# Patient Record
Sex: Female | Born: 1960 | Hispanic: No | Marital: Married | Smoking: Never smoker
Health system: Southern US, Community
[De-identification: ages and names within clinical notes are randomized; demographics above are authoritative.]

## PROBLEM LIST (undated history)

## (undated) DIAGNOSIS — I10 Essential (primary) hypertension: Secondary | ICD-10-CM

## (undated) DIAGNOSIS — D219 Benign neoplasm of connective and other soft tissue, unspecified: Secondary | ICD-10-CM

## (undated) DIAGNOSIS — R011 Cardiac murmur, unspecified: Secondary | ICD-10-CM

## (undated) DIAGNOSIS — E7800 Pure hypercholesterolemia, unspecified: Secondary | ICD-10-CM

## (undated) HISTORY — DX: Cardiac murmur, unspecified: R01.1

## (undated) HISTORY — DX: Essential (primary) hypertension: I10

## (undated) HISTORY — DX: Benign neoplasm of connective and other soft tissue, unspecified: D21.9

## (undated) HISTORY — PX: HEMORROIDECTOMY: SUR656

## (undated) HISTORY — DX: Pure hypercholesterolemia, unspecified: E78.00

---

## 1998-12-18 ENCOUNTER — Other Ambulatory Visit: Admission: RE | Admit: 1998-12-18 | Discharge: 1998-12-18 | Payer: Self-pay | Admitting: Gynecology

## 1998-12-18 ENCOUNTER — Encounter (INDEPENDENT_AMBULATORY_CARE_PROVIDER_SITE_OTHER): Payer: Self-pay

## 2000-08-17 ENCOUNTER — Other Ambulatory Visit: Admission: RE | Admit: 2000-08-17 | Discharge: 2000-08-17 | Payer: Self-pay | Admitting: *Deleted

## 2001-12-02 ENCOUNTER — Other Ambulatory Visit: Admission: RE | Admit: 2001-12-02 | Discharge: 2001-12-02 | Payer: Self-pay | Admitting: *Deleted

## 2002-12-15 ENCOUNTER — Other Ambulatory Visit: Admission: RE | Admit: 2002-12-15 | Discharge: 2002-12-15 | Payer: Self-pay | Admitting: Obstetrics and Gynecology

## 2004-01-30 ENCOUNTER — Other Ambulatory Visit: Admission: RE | Admit: 2004-01-30 | Discharge: 2004-01-30 | Payer: Self-pay | Admitting: Obstetrics and Gynecology

## 2005-01-30 ENCOUNTER — Other Ambulatory Visit: Admission: RE | Admit: 2005-01-30 | Discharge: 2005-01-30 | Payer: Self-pay | Admitting: Obstetrics and Gynecology

## 2006-01-08 ENCOUNTER — Other Ambulatory Visit: Admission: RE | Admit: 2006-01-08 | Discharge: 2006-01-08 | Payer: Self-pay | Admitting: Obstetrics and Gynecology

## 2007-01-17 ENCOUNTER — Other Ambulatory Visit: Admission: RE | Admit: 2007-01-17 | Discharge: 2007-01-17 | Payer: Self-pay | Admitting: Obstetrics and Gynecology

## 2008-01-04 ENCOUNTER — Encounter: Payer: Self-pay | Admitting: Obstetrics and Gynecology

## 2008-01-04 ENCOUNTER — Ambulatory Visit: Payer: Self-pay | Admitting: Obstetrics and Gynecology

## 2008-01-04 ENCOUNTER — Other Ambulatory Visit: Admission: RE | Admit: 2008-01-04 | Discharge: 2008-01-04 | Payer: Self-pay | Admitting: Obstetrics and Gynecology

## 2009-01-04 ENCOUNTER — Other Ambulatory Visit: Admission: RE | Admit: 2009-01-04 | Discharge: 2009-01-04 | Payer: Self-pay | Admitting: Obstetrics and Gynecology

## 2009-01-04 ENCOUNTER — Ambulatory Visit: Payer: Self-pay | Admitting: Obstetrics and Gynecology

## 2009-03-28 ENCOUNTER — Ambulatory Visit (HOSPITAL_BASED_OUTPATIENT_CLINIC_OR_DEPARTMENT_OTHER): Admission: RE | Admit: 2009-03-28 | Discharge: 2009-03-28 | Payer: Self-pay | Admitting: General Surgery

## 2010-01-08 ENCOUNTER — Ambulatory Visit: Payer: Self-pay | Admitting: Obstetrics and Gynecology

## 2010-01-08 ENCOUNTER — Other Ambulatory Visit
Admission: RE | Admit: 2010-01-08 | Discharge: 2010-01-08 | Payer: Self-pay | Source: Home / Self Care | Admitting: Obstetrics and Gynecology

## 2011-01-21 DIAGNOSIS — R011 Cardiac murmur, unspecified: Secondary | ICD-10-CM | POA: Insufficient documentation

## 2011-01-21 DIAGNOSIS — E7800 Pure hypercholesterolemia, unspecified: Secondary | ICD-10-CM | POA: Insufficient documentation

## 2011-01-30 ENCOUNTER — Ambulatory Visit: Payer: PRIVATE HEALTH INSURANCE | Admitting: Obstetrics and Gynecology

## 2011-01-30 ENCOUNTER — Ambulatory Visit (INDEPENDENT_AMBULATORY_CARE_PROVIDER_SITE_OTHER): Payer: PRIVATE HEALTH INSURANCE

## 2011-01-30 ENCOUNTER — Ambulatory Visit (INDEPENDENT_AMBULATORY_CARE_PROVIDER_SITE_OTHER): Payer: PRIVATE HEALTH INSURANCE | Admitting: Obstetrics and Gynecology

## 2011-01-30 ENCOUNTER — Other Ambulatory Visit (HOSPITAL_COMMUNITY)
Admission: RE | Admit: 2011-01-30 | Discharge: 2011-01-30 | Disposition: A | Discharge status: Home / Self Care | Payer: PRIVATE HEALTH INSURANCE | Source: Ambulatory Visit | Attending: Obstetrics and Gynecology | Admitting: Obstetrics and Gynecology

## 2011-01-30 ENCOUNTER — Ambulatory Visit: Payer: Self-pay

## 2011-01-30 ENCOUNTER — Encounter: Payer: Self-pay | Admitting: Obstetrics and Gynecology

## 2011-01-30 ENCOUNTER — Other Ambulatory Visit: Payer: Self-pay

## 2011-01-30 ENCOUNTER — Other Ambulatory Visit: Payer: Self-pay | Admitting: Obstetrics and Gynecology

## 2011-01-30 ENCOUNTER — Ambulatory Visit: Payer: Self-pay | Admitting: Obstetrics and Gynecology

## 2011-01-30 VITALS — BP 130/76 | Ht 62.25 in | Wt 146.0 lb

## 2011-01-30 DIAGNOSIS — N852 Hypertrophy of uterus: Secondary | ICD-10-CM

## 2011-01-30 DIAGNOSIS — Z01419 Encounter for gynecological examination (general) (routine) without abnormal findings: Secondary | ICD-10-CM | POA: Insufficient documentation

## 2011-01-30 DIAGNOSIS — R102 Pelvic and perineal pain: Secondary | ICD-10-CM

## 2011-01-30 DIAGNOSIS — D649 Anemia, unspecified: Secondary | ICD-10-CM

## 2011-01-30 DIAGNOSIS — D219 Benign neoplasm of connective and other soft tissue, unspecified: Secondary | ICD-10-CM

## 2011-01-30 DIAGNOSIS — D259 Leiomyoma of uterus, unspecified: Secondary | ICD-10-CM

## 2011-01-30 NOTE — Progress Notes (Signed)
Patient came to see me today for her annual GYN exam. She continues to have very large fibroids. She is really asymptomatic. She says her periods are fine. She does have iron deficiency anemia. For a while she took iron but stopped. She brought her lab work today and she does need to be back on iron. She is up-to-date on mammograms. She has no abnormal bleeding. She is having no pelvic pain. She contraceptives by withdrawal.  Physical examination: Ashlee Harmon present HEENT within normal limits. Neck: Thyroid not large. No masses. Supraclavicular nodes: not enlarged. Breasts: Examined in both sitting midline position. No skin changes and no masses. Abdomen: Soft no guarding rebound or masses or hernia. Pelvic: External: Within normal limits. BUS: Within normal limits. Vaginal:within normal limits. Good estrogen effect. No evidence of cystocele rectocele or enterocele. Cervix: clean. Uterus: 20 week size fibroids. Adnexa: cannot evaluate due to large size of fibroids. Rectovaginal exam: Confirmatory and negative. Extremities: Within normal limits.  Assessment: Fibroids with anemia  Plan: Due to large size of fibroids we went ahead and ultrasound her to evaluate her ovaries. Her uterus is enlarged by multiple fibroids. The largest is 10 cm. See report for dimensions of other ones. Her endometrial echo is 6.3 mm. She has no ovarian pathology. Her cul-de-sac is free of fluid. She was reassured. She doesn't have indications for surgery. She will reinitiate ferrous sulfate 1 daily. We'll continue yearly mammograms. We'll continue yearly ultrasounds to evaluate her ovaries.

## 2011-02-05 ENCOUNTER — Encounter: Payer: Self-pay | Admitting: Obstetrics and Gynecology

## 2011-02-07 IMAGING — CR DG CHEST 2V
2 series · 2 of 2 positions shown · non-contrast
Comparison: None

CLINICAL DATA: Preop hemorrhoids.  Hypertension.

CHEST - 2 VIEW

[w chest pa]
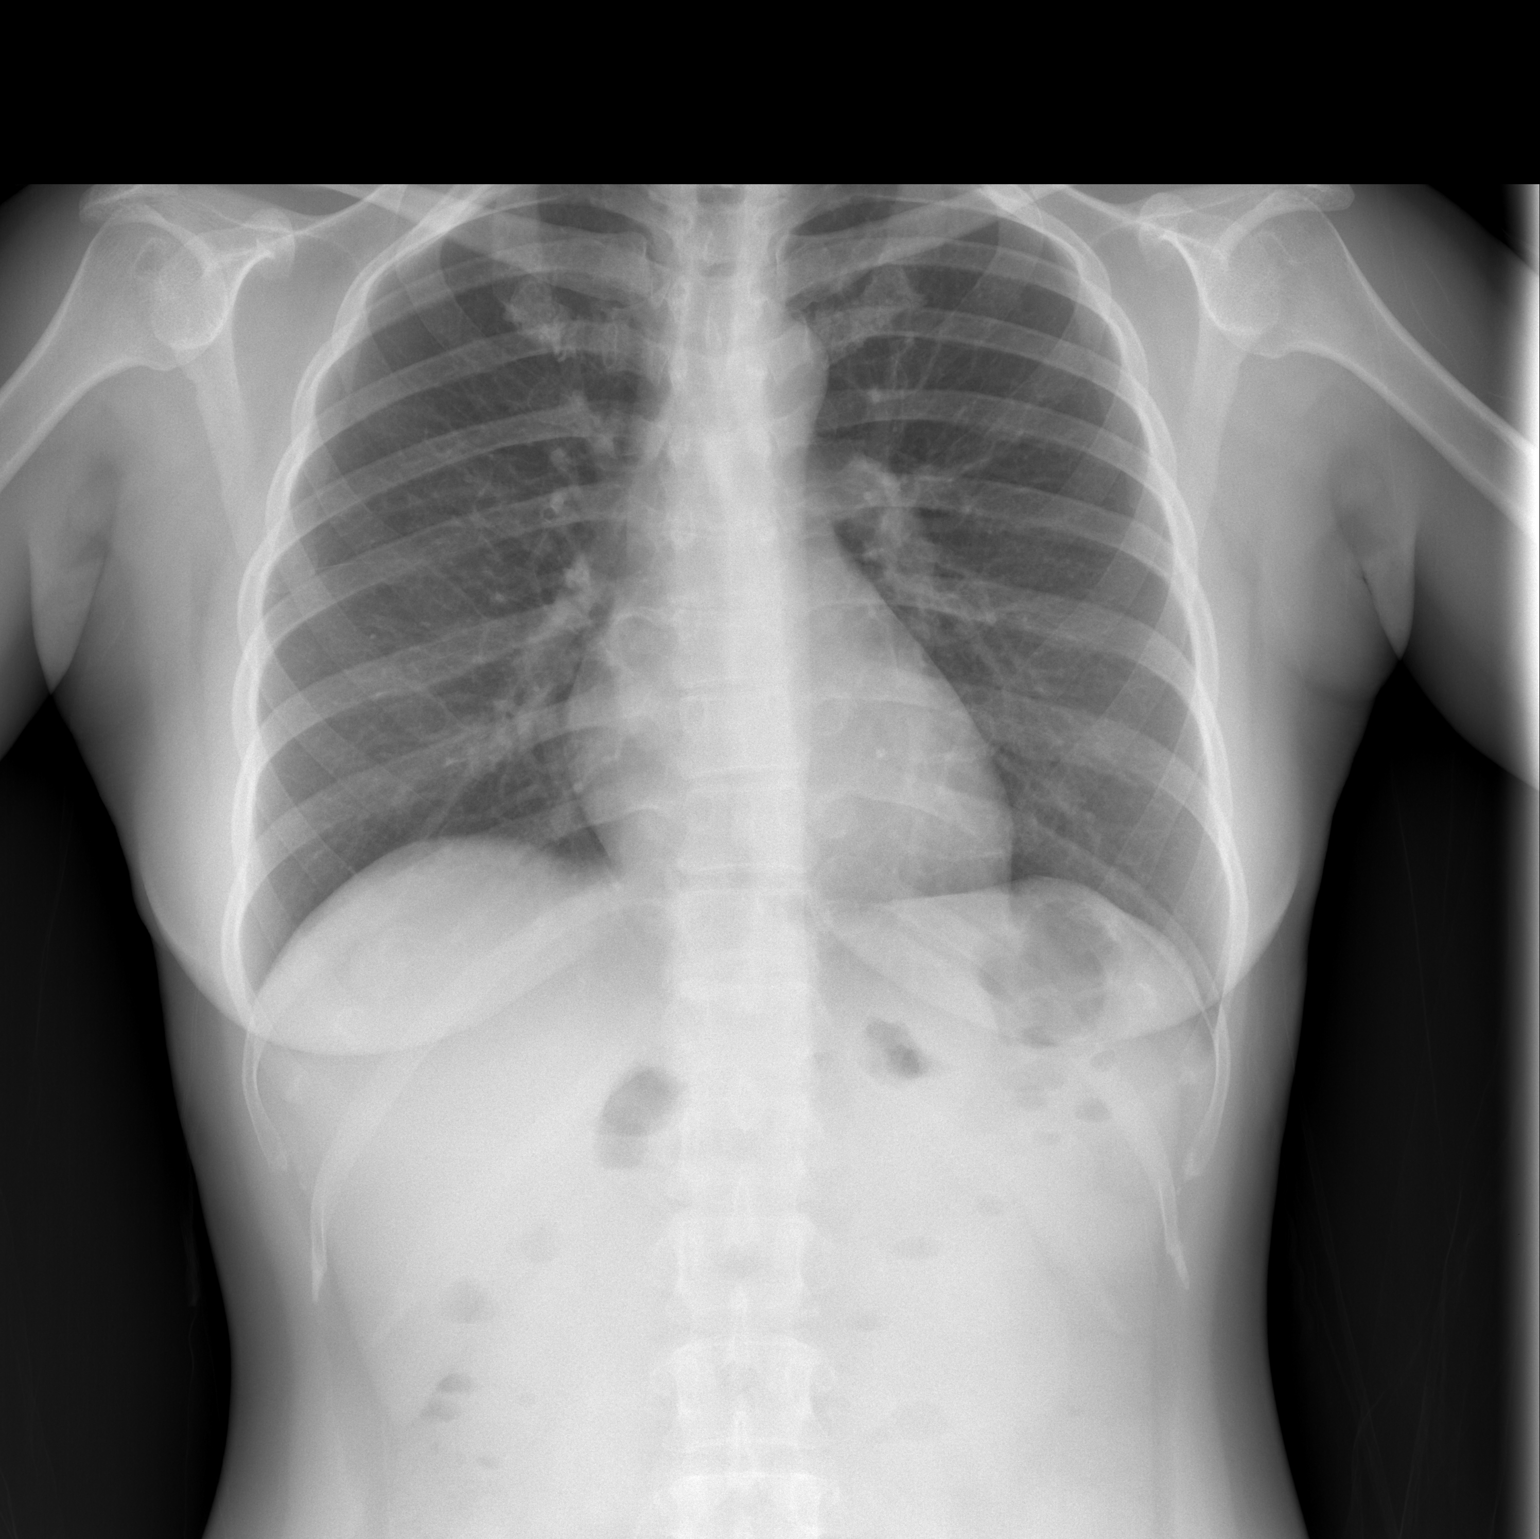

[w chest lat]
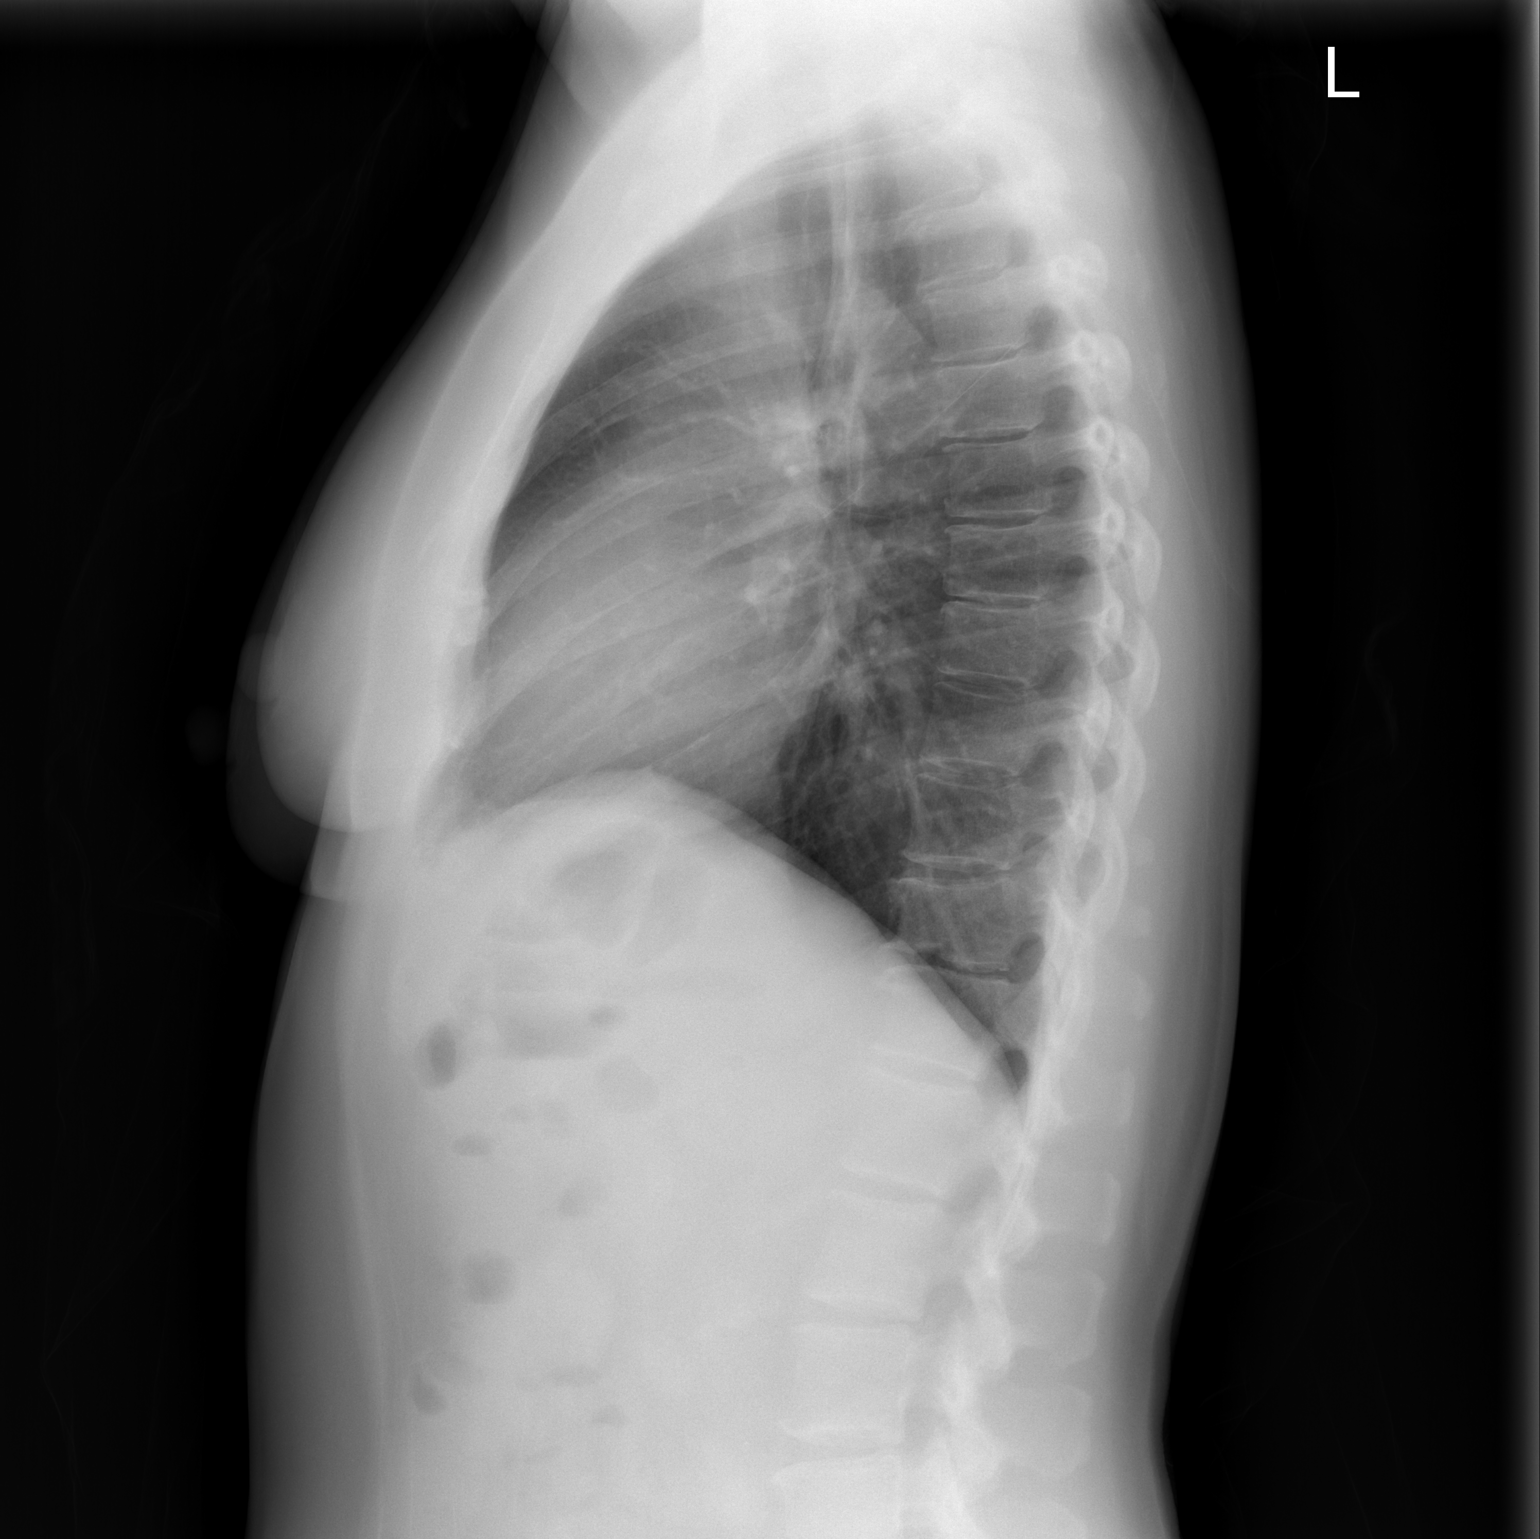

[2 of 2 positions shown; findings below may reference images not displayed]

FINDINGS: Heart and mediastinal contours are within normal limits.
No focal opacities or effusions.  No acute bony abnormality.
IMPRESSION: No active disease.

## 2011-07-03 ENCOUNTER — Encounter: Payer: Self-pay | Admitting: Obstetrics and Gynecology

## 2011-12-17 ENCOUNTER — Telehealth: Payer: Self-pay | Admitting: *Deleted

## 2011-12-17 DIAGNOSIS — D219 Benign neoplasm of connective and other soft tissue, unspecified: Secondary | ICD-10-CM

## 2011-12-17 NOTE — Telephone Encounter (Signed)
Pt called to schedule annual for next month and asked if should could have ultrasound same day.Due to fibroid. Okay to do this per office note. Order placed.

## 2012-02-01 ENCOUNTER — Ambulatory Visit (INDEPENDENT_AMBULATORY_CARE_PROVIDER_SITE_OTHER): Payer: PRIVATE HEALTH INSURANCE | Admitting: Obstetrics and Gynecology

## 2012-02-01 ENCOUNTER — Other Ambulatory Visit: Payer: Self-pay | Admitting: Obstetrics and Gynecology

## 2012-02-01 ENCOUNTER — Ambulatory Visit (INDEPENDENT_AMBULATORY_CARE_PROVIDER_SITE_OTHER): Payer: PRIVATE HEALTH INSURANCE

## 2012-02-01 ENCOUNTER — Encounter: Payer: Self-pay | Admitting: Obstetrics and Gynecology

## 2012-02-01 VITALS — BP 140/84 | Ht 60.5 in | Wt 128.0 lb

## 2012-02-01 DIAGNOSIS — N859 Noninflammatory disorder of uterus, unspecified: Secondary | ICD-10-CM

## 2012-02-01 DIAGNOSIS — N83339 Acquired atrophy of ovary and fallopian tube, unspecified side: Secondary | ICD-10-CM

## 2012-02-01 DIAGNOSIS — D251 Intramural leiomyoma of uterus: Secondary | ICD-10-CM

## 2012-02-01 DIAGNOSIS — D259 Leiomyoma of uterus, unspecified: Secondary | ICD-10-CM

## 2012-02-01 DIAGNOSIS — D219 Benign neoplasm of connective and other soft tissue, unspecified: Secondary | ICD-10-CM

## 2012-02-01 DIAGNOSIS — N858 Other specified noninflammatory disorders of uterus: Secondary | ICD-10-CM

## 2012-02-01 DIAGNOSIS — N852 Hypertrophy of uterus: Secondary | ICD-10-CM

## 2012-02-01 DIAGNOSIS — Z01419 Encounter for gynecological examination (general) (routine) without abnormal findings: Secondary | ICD-10-CM

## 2012-02-01 DIAGNOSIS — D252 Subserosal leiomyoma of uterus: Secondary | ICD-10-CM

## 2012-02-01 NOTE — Progress Notes (Signed)
Patient came to see me today for her annual GYN exam. We have been watching her with large fibroids for a considerable number of years. She is is completely asymptomatic. She does have slightly heavy periods but she always felt but they are tolerable. Her hemoglobin is 13.8. We have discussed hysterectomy for many years but she's always been reluctant to do so. Last year her uterus was 20 weeks size but  her ovaries could be imaged on ultrasound. On ultrasound today she continues to have many large fibroids and they have increased in size from last year. Please see dimensions on ultrasound report. Her endometrial echo is 5.7 mm. Her fibroids are subserosal and intramural. There is fluid in the endometrial cavity. This year her ovaries can not be imaged as in the past. They use withdrawal for birth control. She is aware that it is not a particularly effective method in terms of pregnancy rate but that is what she wants to do. Certainly the  large fibroids make pregnancy unlikely. She is having no pelvic pain or pressure symptoms. She has always had normal Pap smears. Her last Pap smear was 2012. She had a normal mammogram this year.  Physical examination:Kari wilkinson presentHEENT within normal limits. Neck: Thyroid not large. No masses. Supraclavicular nodes: not enlarged. Breasts: Examined in both sitting and lying  position. No skin changes and no masses. Abdomen: Soft no guarding rebound or masses except fibroids. Pelvic: External: Within normal limits. BUS: Within normal limits. Vaginal:within normal limits. Good estrogen effect. No evidence of cystocele rectocele or enterocele. Cervix: clean. Uterus: 23 week fibroids (2 fingerbreaths above umbilicus). Adnexa: No masses. Rectovaginal exam: Confirmatory and negative. Extremities: Within normal limits.  Assessment: Large fibroids with inability to feel the ovaries or  see them on ultrasound.  Plan: we we discussed hysterectomy today. We once again  discussed the problems with not being able to evaluate her ovaries and the risks that she has ovarian pathology. We discussed the fact that the fibroids are  growing makes sarcoma an unlikely but possible scenario. She knows we would only be able to diagnose that with surgery. We discussed risks of infection. For the moment she still wants to watch them. She will continue yearly exams with ultrasound. She appreciates that there are significant number of pregnancy failures with withdrawal. She will continue yearly mammograms. Pap not done.The new Pap smear guidelines were discussed with the patient.

## 2012-02-01 NOTE — Patient Instructions (Signed)
Continue yearly mammograms. Continue yearly ultrasounds.

## 2012-02-02 LAB — URINALYSIS W MICROSCOPIC + REFLEX CULTURE
Bacteria, UA: NONE SEEN
Bilirubin Urine: NEGATIVE
Glucose, UA: NEGATIVE mg/dL
Ketones, ur: NEGATIVE mg/dL
Nitrite: NEGATIVE
Protein, ur: NEGATIVE mg/dL
Specific Gravity, Urine: 1.015 (ref 1.005–1.030)
pH: 6.5 (ref 5.0–8.0)

## 2012-07-04 ENCOUNTER — Encounter: Payer: Self-pay | Admitting: Gynecology

## 2012-10-05 ENCOUNTER — Other Ambulatory Visit: Payer: Self-pay | Admitting: Gynecology

## 2012-10-05 DIAGNOSIS — D219 Benign neoplasm of connective and other soft tissue, unspecified: Secondary | ICD-10-CM

## 2013-01-18 ENCOUNTER — Ambulatory Visit (INDEPENDENT_AMBULATORY_CARE_PROVIDER_SITE_OTHER): Payer: PRIVATE HEALTH INSURANCE | Admitting: Gynecology

## 2013-01-18 ENCOUNTER — Other Ambulatory Visit: Payer: Self-pay | Admitting: Gynecology

## 2013-01-18 ENCOUNTER — Other Ambulatory Visit: Payer: PRIVATE HEALTH INSURANCE

## 2013-01-18 ENCOUNTER — Encounter: Payer: PRIVATE HEALTH INSURANCE | Admitting: Gynecology

## 2013-01-18 ENCOUNTER — Ambulatory Visit (INDEPENDENT_AMBULATORY_CARE_PROVIDER_SITE_OTHER): Payer: PRIVATE HEALTH INSURANCE

## 2013-01-18 ENCOUNTER — Encounter: Payer: Self-pay | Admitting: Gynecology

## 2013-01-18 VITALS — BP 118/76 | Ht 59.75 in | Wt 125.0 lb

## 2013-01-18 DIAGNOSIS — N858 Other specified noninflammatory disorders of uterus: Secondary | ICD-10-CM

## 2013-01-18 DIAGNOSIS — N951 Menopausal and female climacteric states: Secondary | ICD-10-CM

## 2013-01-18 DIAGNOSIS — R9389 Abnormal findings on diagnostic imaging of other specified body structures: Secondary | ICD-10-CM

## 2013-01-18 DIAGNOSIS — D251 Intramural leiomyoma of uterus: Secondary | ICD-10-CM

## 2013-01-18 DIAGNOSIS — D259 Leiomyoma of uterus, unspecified: Secondary | ICD-10-CM

## 2013-01-18 DIAGNOSIS — Z01419 Encounter for gynecological examination (general) (routine) without abnormal findings: Secondary | ICD-10-CM

## 2013-01-18 DIAGNOSIS — Z8639 Personal history of other endocrine, nutritional and metabolic disease: Secondary | ICD-10-CM

## 2013-01-18 DIAGNOSIS — D252 Subserosal leiomyoma of uterus: Secondary | ICD-10-CM

## 2013-01-18 DIAGNOSIS — D219 Benign neoplasm of connective and other soft tissue, unspecified: Secondary | ICD-10-CM

## 2013-01-18 DIAGNOSIS — Z1159 Encounter for screening for other viral diseases: Secondary | ICD-10-CM

## 2013-01-18 DIAGNOSIS — N859 Noninflammatory disorder of uterus, unspecified: Secondary | ICD-10-CM

## 2013-01-18 DIAGNOSIS — N852 Hypertrophy of uterus: Secondary | ICD-10-CM

## 2013-01-18 LAB — PREGNANCY, URINE: Preg Test, Ur: NEGATIVE

## 2013-01-18 MED ORDER — VITAMIN D (ERGOCALCIFEROL) 1.25 MG (50000 UNIT) PO CAPS: ORAL_CAPSULE | ORAL | Status: DC

## 2013-01-18 NOTE — Addendum Note (Signed)
Addended by: Bertram Savin A on: 01/18/2013 10:33 AM   Modules accepted: Orders

## 2013-01-18 NOTE — Progress Notes (Signed)
Ashlee Harmon [birth year] [id number]   History:    52 y.o.  for annual gyn exam who has had long standing history of fibroid uterus and has been followed with annual ultrasounds. Patient has been asymptomatic and endolaser motor symptoms. She did state that she had a cycle in March and April but did not have one in May through August but did have a cycle September October and none in November. 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 In 2006 and 13 her uterus was 18 week size and normal ovaries today's ultrasound with the following:  Uterus measured 22.2 x 15.1 x 10.6 cm with endometrial stripe of 4 mm. Several subserosal intramural myomas totaling 6 the largest one measuring 54 x 45 mm. Endometrial cavity with with fluid filled area in the fundus. Noted echogenic mass left uterus suggestive within endometrial cavity measured 70 bites 26 x 25 mm. Right and left ovary were otherwise normal.  Patient is using withdrawal for contraception. She is a gravida 3 para 2 AB 1 (one cesarean section and one vaginal delivery in the past). Patient has always had normal Pap smears. Patient's last colonoscopy was in 2011 reports be normal. She has not had a bone density study as of yet.  Past medical history,surgical history, family history and social history were all reviewed and documented in the EPIC chart.  Gynecologic History Patient's last menstrual period was 11/18/2012. Contraception: none Last Pap: 2012. Results were: normal Last mammogram: 2014. Results were: normal  Obstetric History OB History  Gravida Para Term Preterm AB SAB TAB Ectopic Multiple Living  3 2   1     2     # Outcome Date GA Lbr Len/2nd Weight Sex Delivery Anes PTL Lv  3 ABT           2 PAR           1 PAR                ROS: A ROS was performed and pertinent positives  and negatives are included in the history.  GENERAL: No fevers or chills. HEENT: No change in vision, no earache, sore throat or sinus congestion. NECK: No pain or stiffness. CARDIOVASCULAR: No chest pain or pressure. No palpitations. PULMONARY: No shortness of breath, cough or wheeze. GASTROINTESTINAL: No abdominal pain, nausea, vomiting or diarrhea, melena or bright red blood per rectum. GENITOURINARY: No urinary frequency, urgency, hesitancy or dysuria. MUSCULOSKELETAL: No joint or muscle pain, no back pain, no recent trauma. DERMATOLOGIC: No rash, no itching, no lesions. ENDOCRINE: No polyuria, polydipsia, no heat or cold intolerance. No recent change in weight. HEMATOLOGICAL: No anemia or easy bruising or bleeding. NEUROLOGIC: No headache, seizures, numbness, tingling or weakness. PSYCHIATRIC: No depression, no loss of interest in normal activity or change in sleep pattern.     Exam: chaperone present  BP 118/76  Ht 4' 11.75" (1.518 m)  Wt 125 lb (56.7 kg)  BMI 24.61 kg/m2  LMP 11/18/2012  Body mass index is 24.61 kg/(m^2).  General appearance : Well developed well nourished female. No acute distress HEENT: Neck supple, trachea midline, no carotid bruits, no thyroidmegaly Lungs: Clear to auscultation, no rhonchi or wheezes, or rib retractions  Heart: Regular rate and rhythm, no murmurs or gallops Breast:Examined in sitting and supine position were symmetrical  in appearance, no palpable masses or tenderness,  no skin retraction, no nipple inversion, no nipple discharge, no skin discoloration, no axillary or supraclavicular lymphadenopathy Abdomen: no palpable masses or tenderness, no rebound or guarding Extremities: no edema or skin discoloration or tenderness  Pelvic:  Bartholin, Urethra, Skene Glands: Within normal limits             Vagina: No gross lesions or discharge  Cervix: No gross lesions or discharge  Uterus  Fundal height approximately 2 fingerbreadths above the  umbilicus  Adnexa Difficult to assess due to size of uterus  Anus and perineum  normal   Rectovaginal  normal sphincter tone without palpated masses or tenderness             Hemoccult PCP provides     Assessment/Plan:  52 y.o. female for annual exam who is perimenopausal has had several menstrual cycles this year but no visible motor symptoms. We will check an [facility] today. Patient with enlarging fibroid uterus throughout the years. We discussed the concerns about malignant transformation of these fibroids into sarcomas  when they should be decreasing in size . The patient would like to now consider having a hysterectomy sometime in the beginning of next year. We did do an endometrial biopsy after she was counseled and tissues submitted for histological evaluation. Her vitamin D deficiency she'll be treated with vitamin D 50,000 units one tablet by mouth q. Weekly for 12 weeks. She will then return to the office for vitamin D level and then she may resume at 2000 units daily. Meanwhile she should continue on calcium 1200 mg daily along with weightbearing exercises for osteoporosis prevention. Next year we will do a bone density study. Pap smear was not done today in accordance to the new guidelines.  New CDC guidelines is recommending patients be tested once in her lifetime for hepatitis C antibody who were born between [birth year] through 1967. This was discussed with the patient today and has agreed to be tested today.  Literature and information on fibroids and hysterectomy was provided. Urine pregnancy test had been obtained as well.  Note: This dictation was prepared with  Dragon/digital dictation along withSmart phrase technology. Any transcriptional errors that result from this process are unintentional.   Ok Edwards MD, 9:49 AM 01/18/2013

## 2013-01-18 NOTE — Patient Instructions (Addendum)
Hysterectomy Information  A hysterectomy is a procedure where your uterus is surgically removed. It will no longer be possible to have menstrual periods or to become pregnant. The tubes and ovaries can be removed (bilateral salpingo-oopherectomy) during this surgery as well.  REASONS FOR A HYSTERECTOMY  Persistent, abnormal bleeding.  Lasting (chronic) pelvic pain or infection.  The lining of the uterus (endometrium) starts growing outside the uterus (endometriosis).  The endometrium starts growing in the muscle of the uterus (adenomyosis).  The uterus falls down into the vagina (pelvic organ prolapse).  Symptomatic uterine fibroids.  Precancerous cells.  Cervical cancer or uterine cancer. TYPES OF HYSTERECTOMIES  Supracervical hysterectomy. This type removes the top part of the uterus, but not the cervix.  Total hysterectomy. This type removes the uterus and cervix.  Radical hysterectomy. This type removes the uterus, cervix, and the fibrous tissue that holds the uterus in place in the pelvis (parametrium). WAYS A HYSTERECTOMY CAN BE PERFORMED  Abdominal hysterectomy. A large surgical cut (incision) is made in the abdomen. The uterus is removed through this incision.  Vaginal hysterectomy. An incision is made in the vagina. The uterus is removed through this incision. There are no abdominal incisions.  Conventional laparoscopic hysterectomy. A thin, lighted tube with a camera (laparoscope) is inserted into 3 or 4 small incisions in the abdomen. The uterus is cut into small pieces. The small pieces are removed through the incisions, or they are removed through the vagina.  Laparoscopic assisted vaginal hysterectomy (LAVH). Three or four small incisions are made in the abdomen. Part of the surgery is performed laparoscopically and part vaginally. The uterus is removed through the vagina.  Robot-assisted laparoscopic hysterectomy. A laparoscope is inserted into 3 or 4 small  incisions in the abdomen. A computer-controlled device is used to give the surgeon a 3D image. This allows for more precise movements of surgical instruments. The uterus is cut into small pieces and removed through the incisions or removed through the vagina. RISKS OF HYSTERECTOMY   Bleeding and risk of blood transfusion. Tell your caregiver if you do not want to receive any blood products.  Blood clots in the legs or lung.  Infection.  Injury to surrounding organs.  Anesthesia problems or side effects.  Conversion to an abdominal hysterectomy. WHAT TO EXPECT AFTER A HYSTERECTOMY  You will be given pain medicine.  You will need to have someone with you for the first 3 to 5 days after you go home.  You will need to follow up with your surgeon in 2 to 4 weeks after surgery to evaluate your progress.  You may have early menopause symptoms like hot flashes, night sweats, and insomnia.  If you had a hysterectomy for a problem that was not a cancer or a condition that could lead to cancer, then you no longer need Pap tests. However, even if you no longer need a Pap test, a regular exam is a good idea to make sure no other problems are starting. Document Released: 07/15/2000 Document Revised: 04/13/2011 Document Reviewed: 08/30/2010 [facility] Patient Information 2014 [place], [place]. Uterine Fibroid A uterine fibroid is a growth (tumor) that occurs in a woman's uterus. This type of tumor is not cancerous and does not spread out of the uterus. A woman can have one or many fibroids, and the fiboid(s) can become quite large. A fibroid can vary in size, weight, and where it grows in the uterus. Most fibroids do not require medical treatment, but some can cause pain  or heavy bleeding during and between periods. CAUSES  A fibroid is the result of a single uterine cell that keeps growing (unregulated), which is different than most cells in the human body. Most cells have a control mechanism that keeps  them from reproducing without control.  SYMPTOMS   Bleeding.  Pelvic pain and pressure.  Bladder problems due to the size of the fibroid.  Infertility and miscarriages depending on the size and location of the fibroid. DIAGNOSIS  A diagnosis is made by physical exam. Your caregiver may feel the lumpy tumors during a pelvic exam. Important information regarding size, location, and number of tumors can be gained by having an ultrasound. It is rare that other tests, such as a CT scan or MRI, are needed. TREATMENT   Your caregiver may recommend watchful waiting. This involves getting the fibroid checked by your caregiver to see if the fibroids grow or shrink.   Hormonal treatment or an intrauterine device (IUD) may be prescribed.   Surgery may be needed to remove the fibroids (myomectomy) or the uterus (hysterectomy). This depends on your situation. When fibroids interfere with fertility and a woman wants to become pregnant, a caregiver may recommend having the fibroids removed.  HOME CARE INSTRUCTIONS  Home care depends on how you were treated. In general:   Keep all follow-up appointments with your caregiver.   Only take medicine as told by your caregiver. Do not take aspirin. It can cause bleeding.   If you have excessive periods and soak tampons or pads in a half hour or less, contact your caregiver immediately. If your periods are troublesome but not so heavy, lie down with your feet raised slightly above your heart. Place cold packs on your lower abdomen.   If your periods are heavy, write down the number of pads or tampons you use per month. Bring this information to your caregiver.   Talk to your caregiver about taking iron pills.   Include green vegetables in your diet.   If you were prescribed a hormonal treatment, take the hormonal medicines as directed.   If you need surgery, ask your caregiver for information on your specific surgery.  SEEK IMMEDIATE MEDICAL  CARE IF:  You have pelvic pain or cramps not controlled with medicines.   You have a sudden increase in pelvic pain.   You have an increase of bleeding between and during periods.   You feel lightheaded or have fainting episodes.  MAKE SURE YOU:  Understand these instructions.  Will watch your condition.  Will get help right away if you are not doing well or get worse. Document Released: 01/17/2000 Document Revised: 04/13/2011 Document Reviewed: 08/18/2012 [facility] Patient Information 2014 [place], [place]. Shingles Vaccine What You Need to Know WHAT IS SHINGLES?  Shingles is a painful skin rash, often with blisters. It is also called Herpes Zoster or just Zoster.  A shingles rash usually appears on one side of the face or body and lasts from 2 to 4 weeks. Its main symptom is pain, which can be quite severe. Other symptoms of shingles can include fever, headache, chills, and upset stomach. Very rarely, a shingles infection can lead to pneumonia, hearing problems, blindness, brain inflammation (encephalitis), or death.  For about 1 person in 5, severe pain can continue even after the rash clears up. This is called post-herpetic neuralgia.  Shingles is caused by the Varicella Zoster virus. This is the same virus that causes chickenpox. Only someone who has had a case of  chickenpox or rarely, has gotten chickenpox vaccine, can get shingles. The virus stays in your body. It can reappear many years later to cause a case of shingles.  You cannot catch shingles from another person with shingles. However, a person who has never had chickenpox (or chickenpox vaccine) could get chickenpox from someone with shingles. This is not very common.  Shingles is far more common in people 80 and older than in younger people. It is also more common in people whose immune systems are weakened because of a disease such as cancer or drugs such as steroids or chemotherapy.  At least 1 million people get  shingles per year in the [place]. SHINGLES VACCINE  A vaccine for shingles was licensed in 2006. In clinical trials, the vaccine reduced the risk of shingles by 50%. It can also reduce the pain in people who still get shingles after being vaccinated.  A single dose of shingles vaccine is recommended for adults 62 years of age and older. SOME PEOPLE SHOULD NOT GET SHINGLES VACCINE OR SHOULD WAIT A person should not get shingles vaccine if he or she:  Has ever had a life-threatening allergic reaction to gelatin, the antibiotic neomycin, or any other component of shingles vaccine. Tell your caregiver if you have any severe allergies.  Has a weakened immune system because of current:  AIDS or another disease that affects the immune system.  Treatment with drugs that affect the immune system, such as prolonged use of high-dose steroids.  Cancer treatment, such as radiation or chemotherapy.  Cancer affecting the bone marrow or lymphatic system, such as leukemia or lymphoma.  Is pregnant, or might be pregnant. Women should not become pregnant until at least 4 weeks after getting shingles vaccine. Someone with a minor illness, such as a cold, may be vaccinated. Anyone with a moderate or severe acute illness should usually wait until he or she recovers before getting the vaccine. This includes anyone with a temperature of 101.3 F (38 C) or higher. WHAT ARE THE RISKS FROM SHINGLES VACCINE?  A vaccine, like any medicine, could possibly cause serious problems, such as severe allergic reactions. However, the risk of a vaccine causing serious harm, or death, is extremely small.  No serious problems have been identified with shingles vaccine. Mild Problems  Redness, soreness, swelling, or itching at the site of the injection (about 1 person in 3).  Headache (about 1 person in 70). Like all vaccines, shingles vaccine is being closely monitored for unusual or severe problems. WHAT IF THERE  IS A MODERATE OR SEVERE REACTION? What should I look for? Any unusual condition, such as a severe allergic reaction or a high fever. If a severe allergic reaction occurred, it would be within a few minutes to an hour after the shot. Signs of a serious allergic reaction can include difficulty breathing, weakness, hoarseness or wheezing, a fast heartbeat, hives, dizziness, paleness, or swelling of the throat. What should I do?  Call your caregiver, or get the person to a caregiver right away.  Tell the caregiver what happened, the date and time it happened, and when the vaccination was given.  Ask the caregiver to report the reaction by filing a Vaccine Adverse Event Reporting System (VAERS) form. Or, you can file this report through the VAERS web site at www.vaers.LAgents.no or by calling 1-(531) 039-3364. VAERS does not provide medical advice. HOW CAN I LEARN MORE?  Ask your caregiver. He or she can give you the vaccine package insert or  suggest other sources of information.  Contact the Centers for Disease Control and Prevention (CDC):  Call 971-215-6242 (1-800-CDC-INFO).  Visit the CDC website at http://hunter.com/ CDC Shingles Vaccine VIS (11/08/07) Document Released: 11/16/2005 Document Revised: 04/13/2011 Document Reviewed: 05/11/2012 [facility] Patient Information 2014 [place].

## 2013-02-08 ENCOUNTER — Telehealth: Payer: Self-pay

## 2013-02-08 NOTE — Telephone Encounter (Signed)
At Dr. Durenda Guthrie request I contacted patient to discuss her desire to proceed with TAH in 2015.  Patient said that she had endometrial biopsy done and it was normal and at this time she no longer wants to proceed with hysterectomy.  She did take my name and direct phone number in case she changed her mind she will call me.

## 2013-02-08 NOTE — Telephone Encounter (Signed)
Okay thank you

## 2013-02-08 NOTE — Telephone Encounter (Signed)
error 

## 2013-06-29 ENCOUNTER — Encounter: Payer: Self-pay | Admitting: Gynecology

## 2013-08-31 ENCOUNTER — Telehealth: Payer: Self-pay | Admitting: *Deleted

## 2013-08-31 DIAGNOSIS — D259 Leiomyoma of uterus, unspecified: Secondary | ICD-10-CM

## 2013-08-31 NOTE — Telephone Encounter (Signed)
Pt informed, order placed.  

## 2013-08-31 NOTE — Telephone Encounter (Signed)
Pt due for annual in Dec 2015 asked if you want to have ultrasound with annual as well? Pt has uterus fibroid. Please advise

## 2013-08-31 NOTE — Telephone Encounter (Signed)
Just please

## 2013-12-04 ENCOUNTER — Encounter: Payer: Self-pay | Admitting: Gynecology

## 2014-01-19 ENCOUNTER — Encounter: Payer: Self-pay | Admitting: Gynecology

## 2014-01-19 ENCOUNTER — Ambulatory Visit (INDEPENDENT_AMBULATORY_CARE_PROVIDER_SITE_OTHER): Payer: Commercial Managed Care - PPO

## 2014-01-19 ENCOUNTER — Other Ambulatory Visit (HOSPITAL_COMMUNITY)
Admission: RE | Admit: 2014-01-19 | Discharge: 2014-01-19 | Disposition: A | Discharge status: Home / Self Care | Payer: 59 | Source: Ambulatory Visit | Attending: Gynecology | Admitting: Gynecology

## 2014-01-19 ENCOUNTER — Other Ambulatory Visit: Payer: Self-pay | Admitting: Gynecology

## 2014-01-19 ENCOUNTER — Ambulatory Visit (INDEPENDENT_AMBULATORY_CARE_PROVIDER_SITE_OTHER): Payer: Commercial Managed Care - PPO | Admitting: Gynecology

## 2014-01-19 VITALS — BP 118/76 | Ht 61.0 in | Wt 128.2 lb

## 2014-01-19 DIAGNOSIS — Z1151 Encounter for screening for human papillomavirus (HPV): Secondary | ICD-10-CM | POA: Insufficient documentation

## 2014-01-19 DIAGNOSIS — D259 Leiomyoma of uterus, unspecified: Secondary | ICD-10-CM | POA: Diagnosis not present

## 2014-01-19 DIAGNOSIS — Z1159 Encounter for screening for other viral diseases: Secondary | ICD-10-CM

## 2014-01-19 DIAGNOSIS — D251 Intramural leiomyoma of uterus: Secondary | ICD-10-CM

## 2014-01-19 DIAGNOSIS — N852 Hypertrophy of uterus: Secondary | ICD-10-CM | POA: Diagnosis not present

## 2014-01-19 DIAGNOSIS — R739 Hyperglycemia, unspecified: Secondary | ICD-10-CM

## 2014-01-19 DIAGNOSIS — Z780 Asymptomatic menopausal state: Secondary | ICD-10-CM | POA: Diagnosis not present

## 2014-01-19 DIAGNOSIS — Z01419 Encounter for gynecological examination (general) (routine) without abnormal findings: Secondary | ICD-10-CM | POA: Diagnosis present

## 2014-01-19 DIAGNOSIS — Z23 Encounter for immunization: Secondary | ICD-10-CM | POA: Diagnosis not present

## 2014-01-19 NOTE — Patient Instructions (Addendum)
Abdominal Hysterectomy Abdominal hysterectomy is a surgical procedure to remove your womb (uterus). Your uterus is the muscular organ that contains a developing baby. This surgery is done for many reasons. You may need an abdominal hysterectomy if you have cancer, growths (tumors), long-term pain, or bleeding. You may also have this procedure if your uterus has slipped down into your vagina (uterine prolapse). Depending on why you need an abdominal hysterectomy, you may also have other reproductive organs removed. These could include the part of your vagina that connects with your uterus (cervix), the organs that make eggs (ovaries), and the tubes that connect the ovaries to the uterus (fallopian tubes). LET YOUR HEALTH CARE PROVIDER KNOW ABOUT:   Any allergies you have.  All medicines you are taking, including vitamins, herbs, eye drops, creams, and over-the-counter medicines.  Previous problems you or members of your family have had with the use of anesthetics.  Any blood disorders you have.  Previous surgeries you have had.  Medical conditions you have. RISKS AND COMPLICATIONS Generally, this is a safe procedure. However, as with any procedure, problems can occur. Infection is the most common problem after an abdominal hysterectomy. Other possible problems include:  Bleeding.  Formation of blood clots that may break free and travel to your lungs.  Injury to other organs near your uterus.  Nerve injury causing nerve pain.  Decreased interest in sex or pain during sexual intercourse. BEFORE THE PROCEDURE  Abdominal hysterectomy is a major surgical procedure. It can affect the way you feel about yourself. Talk to your health care provider about the physical and emotional changes hysterectomy may cause.  You may need to have blood work and X-rays done before surgery.  Quit smoking if you smoke. Ask your health care provider for help if you are struggling to quit.  Stop taking  medicines that thin your blood as directed by your health care provider.  You may be instructed to take antibiotic medicines or laxatives before surgery.  Do not eat or drink anything for 6-8 hours before surgery.  Take your regular medicines with a small sip of water.  Bathe or shower the night or morning before surgery. PROCEDURE  Abdominal hysterectomy is done in the operating room at the hospital.  In most cases, you will be given a medicine that makes you go to sleep (general anesthetic).  The surgeon will make a cut (incision) through the skin in your lower belly.  The incision may be about 5-7 inches long. It may go side-to-side or up-and-down.  The surgeon will move aside the body tissue that covers your uterus. The surgeon will then carefully take out your uterus along with any of your other reproductive organs that need to be removed.  Bleeding will be controlled with clamps or sutures.  The surgeon will close your incision with sutures or metal clips. AFTER THE PROCEDURE  You will have some pain immediately after the procedure.  You will be given pain medicine in the recovery room.  You will be taken to your hospital room when you have recovered from the anesthesia.  You may need to stay in the hospital for 2-5 days.  You will be given instructions for recovery at home. Document Released: 01/24/2013 Document Reviewed: 01/24/2013 [employer] Patient Information 2015 [employer]. This information is not intended to replace advice given to you by your health care provider. Make sure you discuss any questions you have with your health care provider. Uterine Fibroid A uterine fibroid is a   growth (tumor) that occurs in your uterus. This type of tumor is not cancerous and does not spread out of the uterus. You can have one or many fibroids. Fibroids can vary in size, weight, and where they grow in the uterus. Some can become quite large. Most fibroids do not require medical  treatment, but some can cause pain or heavy bleeding during and between periods. CAUSES  A fibroid is the result of a single uterine cell that keeps growing (unregulated), which is different than most cells in the human body. Most cells have a control mechanism that keeps them from reproducing without control.  SIGNS AND SYMPTOMS   Bleeding.  Pelvic pain and pressure.  Bladder problems due to the size of the fibroid.  Infertility and miscarriages depending on the size and location of the fibroid. DIAGNOSIS  Uterine fibroids are diagnosed through a physical exam. Your health care provider may feel the lumpy tumors during a pelvic exam. Ultrasonography may be done to get information regarding size, location, and number of tumors.  TREATMENT   Your health care provider may recommend watchful waiting. This involves getting the fibroid checked by your health care provider to see if it grows or shrinks.   Hormone treatment or an intrauterine device (IUD) may be prescribed.   Surgery may be needed to remove the fibroids (myomectomy) or the uterus (hysterectomy). This depends on your situation. When fibroids interfere with fertility and a woman wants to become pregnant, a health care provider may recommend having the fibroids removed.  [place] care depends on how you were treated. In general:   Keep all follow-up appointments with your health care provider.   Only take over-the-counter or prescription medicines as directed by your health care provider. If you were prescribed a hormone treatment, take the hormone medicines exactly as directed. Do not take aspirin. It can cause bleeding.   Talk to your health care provider about taking iron pills.  If your periods are troublesome but not so heavy, lie down with your feet raised slightly above your heart. Place cold packs on your lower abdomen.   If your periods are heavy, write down the number of pads or tampons you  use per month. Bring this information to your health care provider.   Include green vegetables in your diet.  SEEK IMMEDIATE MEDICAL CARE IF:  You have pelvic pain or cramps not controlled with medicines.   You have a sudden increase in pelvic pain.   You have an increase in bleeding between and during periods.   You have excessive periods and soak tampons or pads in a half hour or less.  You feel lightheaded or have fainting episodes. Document Released: 01/17/2000 Document Revised: 11/09/2012 Document Reviewed: 08/18/2012 [facility] Patient Information 2015 [place], [place]. This information is not intended to replace advice given to you by your health care provider. Make sure you discuss any questions you have with your health care provider. Shingles Vaccine What You Need to Know WHAT IS SHINGLES?  Shingles is a painful skin rash, often with blisters. It is also called Herpes Zoster or just Zoster.  A shingles rash usually appears on one side of the face or body and lasts from 2 to 4 weeks. Its main symptom is pain, which can be quite severe. Other symptoms of shingles can include fever, headache, chills, and upset stomach. Very rarely, a shingles infection can lead to pneumonia, hearing problems, blindness, brain inflammation (encephalitis), or death.  For about  1 person in 5, severe pain can continue even after the rash clears up. This is called post-herpetic neuralgia.  Shingles is caused by the Varicella Zoster virus. This is the same virus that causes chickenpox. Only someone who has had a case of chickenpox or rarely, has gotten chickenpox vaccine, can get shingles. The virus stays in your body. It can reappear many years later to cause a case of shingles.  You cannot catch shingles from another person with shingles. However, a person who has never had chickenpox (or chickenpox vaccine) could get chickenpox from someone with shingles. This is not very common.  Shingles is far  more common in people 33 and older than in younger people. It is also more common in people whose immune systems are weakened because of a disease such as cancer or drugs such as steroids or chemotherapy.  At least 1 million people get shingles per year in the [place]. SHINGLES VACCINE  A vaccine for shingles was licensed in 4081. In clinical trials, the vaccine reduced the risk of shingles by 50%. It can also reduce the pain in people who still get shingles after being vaccinated.  A single dose of shingles vaccine is recommended for adults 5 years of age and older. SOME PEOPLE SHOULD NOT GET SHINGLES VACCINE OR SHOULD WAIT A person should not get shingles vaccine if he or she:  Has ever had a life-threatening allergic reaction to gelatin, the antibiotic neomycin, or any other component of shingles vaccine. Tell your caregiver if you have any severe allergies.  Has a weakened immune system because of current:  AIDS or another disease that affects the immune system.  Treatment with drugs that affect the immune system, such as prolonged use of high-dose steroids.  Cancer treatment, such as radiation or chemotherapy.  Cancer affecting the bone marrow or lymphatic system, such as leukemia or lymphoma.  Is pregnant, or might be pregnant. Women should not become pregnant until at least 4 weeks after getting shingles vaccine. Someone with a minor illness, such as a cold, may be vaccinated. Anyone with a moderate or severe acute illness should usually wait until he or she recovers before getting the vaccine. This includes anyone with a temperature of 101.3 F (38 C) or higher. WHAT ARE THE RISKS FROM SHINGLES VACCINE?  A vaccine, like any medicine, could possibly cause serious problems, such as severe allergic reactions. However, the risk of a vaccine causing serious harm, or death, is extremely small.  No serious problems have been identified with shingles vaccine. Mild  Problems  Redness, soreness, swelling, or itching at the site of the injection (about 1 person in 3).  Headache (about 1 person in 30). Like all vaccines, shingles vaccine is being closely monitored for unusual or severe problems. WHAT IF THERE IS A MODERATE OR SEVERE REACTION? What should I look for? Any unusual condition, such as a severe allergic reaction or a high fever. If a severe allergic reaction occurred, it would be within a few minutes to an hour after the shot. Signs of a serious allergic reaction can include difficulty breathing, weakness, hoarseness or wheezing, a fast heartbeat, hives, dizziness, paleness, or swelling of the throat. What should I do?  Call your caregiver, or get the person to a caregiver right away.  Tell the caregiver what happened, the date and time it happened, and when the vaccination was given.  Ask the caregiver to report the reaction by filing a Vaccine Adverse Event Reporting System (VAERS)  form. Or, you can file this report through the VAERS web site at www.vaers.SamedayNews.es or by calling (909)232-3318. VAERS does not provide medical advice. HOW CAN I LEARN MORE?  Ask your caregiver. He or she can give you the vaccine package insert or suggest other sources of information.  Contact the Centers for Disease Control and Prevention (CDC):  Call (580)550-7140 (1-800-CDC-INFO).  Visit the CDC website at http://hunter.com/ CDC Shingles Vaccine VIS (11/08/07) Document Released: 11/16/2005 Document Revised: 04/13/2011 Document Reviewed: 05/11/2012 [facility] Patient Information 2015 [place]. This information is not intended to replace advice given to you by your health care provider. Make sure you discuss any questions you have with your health care provider. Tdap Vaccine (Tetanus, Diphtheria, Pertussis): What You Need to Know 1. Why get vaccinated? Tetanus, diphtheria and pertussis can be very serious diseases, even for adolescents and adults. Tdap  vaccine can protect [place] from these diseases. TETANUS (Lockjaw) causes painful muscle tightening and stiffness, usually all over the body.  It can lead to tightening of muscles in the head and neck so you can't open your mouth, swallow, or sometimes even breathe. Tetanus kills about 1 out of 5 people who are infected. DIPHTHERIA can cause a thick coating to form in the back of the throat.  It can lead to breathing problems, paralysis, heart failure, and death. PERTUSSIS (Whooping Cough) causes severe coughing spells, which can cause difficulty breathing, vomiting and disturbed sleep.  It can also lead to weight loss, incontinence, and rib fractures. Up to 2 in 100 adolescents and 5 in 100 adults with pertussis are hospitalized or have complications, which could include pneumonia or death. These diseases are caused by bacteria. Diphtheria and pertussis are spread from person to person through coughing or sneezing. Tetanus enters the body through cuts, scratches, or wounds. Before vaccines, the [place] saw as many as 200,000 cases a year of diphtheria and pertussis, and hundreds of cases of tetanus. Since vaccination began, tetanus and diphtheria have dropped by about 99% and pertussis by about 80%. 2. Tdap vaccine Tdap vaccine can protect adolescents and adults from tetanus, diphtheria, and pertussis. One dose of Tdap is routinely given at age 3 or 85. People who did not get Tdap at that age should get it as soon as possible. Tdap is especially important for health care professionals and anyone having close contact with a baby younger than 12 months. Pregnant women should get a dose of Tdap during every pregnancy, to protect the newborn from pertussis. Infants are most at risk for severe, life-threatening complications from pertussis. A similar vaccine, called Td, protects from tetanus and diphtheria, but not pertussis. A Td booster should be given every 10 years. Tdap may be given as one of  these boosters if you have not already gotten a dose. Tdap may also be given after a severe cut or burn to prevent tetanus infection. Your doctor can give you more information. Tdap may safely be given at the same time as other vaccines. 3. Some people should not get this vaccine  If you ever had a life-threatening allergic reaction after a dose of any tetanus, diphtheria, or pertussis containing vaccine, OR if you have a severe allergy to any part of this vaccine, you should not get Tdap. Tell your doctor if you have any severe allergies.  If you had a coma, or long or multiple seizures within 7 days after a childhood dose of DTP or DTaP, you should not get Tdap, unless a cause other  than the vaccine was found. You can still get Td.  Talk to your doctor if you:  have epilepsy or another nervous system problem,  had severe pain or swelling after any vaccine containing diphtheria, tetanus or pertussis,  ever had Guillain-Barr Syndrome (GBS),  aren't feeling well on the day the shot is scheduled. 4. Risks of a vaccine reaction With any medicine, including vaccines, there is a chance of side effects. These are usually mild and go away on their own, but serious reactions are also possible. Brief fainting spells can follow a vaccination, leading to injuries from falling. Sitting or lying down for about 15 minutes can help prevent these. Tell your doctor if you feel dizzy or light-headed, or have vision changes or ringing in the ears. Mild problems following Tdap (Did not interfere with activities)  Pain where the shot was given (about 3 in 4 adolescents or 2 in 3 adults)  Redness or swelling where the shot was given (about 1 person in 5)  Mild fever of at least 100.56F (up to about 1 in 25 adolescents or 1 in 100 adults)  Headache (about 3 or 4 people in 10)  Tiredness (about 1 person in 3 or 4)  Nausea, vomiting, diarrhea, stomach ache (up to 1 in 4 adolescents or 1 in 10  adults)  Chills, body aches, sore joints, rash, swollen glands (uncommon) Moderate problems following Tdap (Interfered with activities, but did not require medical attention)  Pain where the shot was given (about 1 in 5 adolescents or 1 in 100 adults)  Redness or swelling where the shot was given (up to about 1 in 16 adolescents or 1 in 25 adults)  Fever over 102F (about 1 in 100 adolescents or 1 in 250 adults)  Headache (about 3 in 20 adolescents or 1 in 10 adults)  Nausea, vomiting, diarrhea, stomach ache (up to 1 or 3 people in 100)  Swelling of the entire arm where the shot was given (up to about 3 in 100). Severe problems following Tdap (Unable to perform usual activities; required medical attention)  Swelling, severe pain, bleeding and redness in the arm where the shot was given (rare). A severe allergic reaction could occur after any vaccine (estimated less than 1 in a million doses). 5. What if there is a serious reaction? What should I look for?  Look for anything that concerns you, such as signs of a severe allergic reaction, very high fever, or behavior changes. Signs of a severe allergic reaction can include hives, swelling of the face and throat, difficulty breathing, a fast heartbeat, dizziness, and weakness. These would start a few minutes to a few hours after the vaccination. What should I do?  If you think it is a severe allergic reaction or other emergency that can't wait, call 9-1-1 or get the person to the nearest hospital. Otherwise, call your doctor.  Afterward, the reaction should be reported to the "Vaccine Adverse Event Reporting System" (VAERS). Your doctor might file this report, or you can do it yourself through the VAERS web site at www.vaers.SamedayNews.es, or by calling 445-341-3632. VAERS is only for reporting reactions. They do not give medical advice.  6. The National Vaccine Injury Compensation Program The [employer] Vaccine Injury Compensation Program  (VICP) is a federal program that was created to compensate people who may have been injured by certain vaccines. Persons who believe they may have been injured by a vaccine can learn about the program and about filing a claim by  calling ([phone] or visiting the [employer] website at GoldCloset.com.ee. 7. How can I learn more?  Ask your doctor.  Call your local or state health department.  Contact the Centers for Disease Control and Prevention (CDC):  Call 718-050-7271 or visit CDC's website at http://hunter.com/. CDC Tdap Vaccine VIS (06/11/11) Document Released: 07/21/2011 Document Revised: 06/05/2013 Document Reviewed: 05/03/2013 [employer] Patient Information 2015 [place], [place]. This information is not intended to replace advice given to you by your health care provider. Make sure you discuss any questions you have with your health care provider.

## 2014-01-19 NOTE — Progress Notes (Signed)
Ashlee Harmon 12-24-1960 [id number]   History:    53 y.o. who presented to the office for her annual gynecological exam was having no complaints. She had her blood work done at her work which she brought a copy with. Her blood work consisted of the following: CBC, conference metabolic panel, fasting lipid profile, anemia panel, vitamin D level which were all normal. Her hemoglobin A1c was 6.1 and her ferritin was slightly low at 13. Patient with long patient with long-standing history of fibroid uterus and has been followed with annual ultrasounds. Patient has been asymptomatic and denies any vasomotor symptoms. She did state that she's having at least 3 cycles per year since she has been in the perimenopausal phase of her life. Ultrasound last year:  Uterus measured 22.2 x 15.1 x 10.6 cm with endometrial stripe of 4 mm. Several subserosal intramural myomas totaling 6 the largest one measuring 54 x 45 mm. Endometrial cavity with with fluid filled area in the fundus. Noted echogenic mass left uterus suggestive within endometrial cavity measured 70 bites 26 x 25 mm. Right and left ovary were otherwise normal.  Today's ultrasound: Uterus is increased in size now measuring 24 x 16 x 9.3 with endometrial 14.8 multiple fibroids the largest one recorded measuring 10.4 x 7.5 x 5.3 cm and ovaries appeared normal no fluid in the cul-de-sac.   Patient is using withdrawal for contraception. She is a gravida 3 para 2 AB 1 (one cesarean section and one vaginal delivery in the past). Patient has always had normal Pap smears. Patient's last colonoscopy was in 2011 reports be normal. She has not had a bone density study as of yet. Patient has received her flu vaccine. Patient reports no past history of abnormal Pap smears.   Past medical history,surgical history, family history and social history were all reviewed and documented in the EPIC chart.  Gynecologic History Patient's last menstrual period was  11/19/2013. Contraception: coitus interruptus Last Pap: 2012. Results were: normal Last mammogram: 2015. Results were: normal  Obstetric History OB History  Gravida Para Term Preterm AB SAB TAB Ectopic Multiple Living  3 2   1     2     # Outcome Date GA Lbr Len/2nd Weight Sex Delivery Anes PTL Lv  3 AB           2 Para           1 Para                ROS: A ROS was performed and pertinent positives and negatives are included in the history.  GENERAL: No fevers or chills. HEENT: No change in vision, no earache, sore throat or sinus congestion. NECK: No pain or stiffness. CARDIOVASCULAR: No chest pain or pressure. No palpitations. PULMONARY: No shortness of breath, cough or wheeze. GASTROINTESTINAL: No abdominal pain, nausea, vomiting or diarrhea, melena or bright red blood per rectum. GENITOURINARY: No urinary frequency, urgency, hesitancy or dysuria. MUSCULOSKELETAL: No joint or muscle pain, no back pain, no recent trauma. DERMATOLOGIC: No rash, no itching, no lesions. ENDOCRINE: No polyuria, polydipsia, no heat or cold intolerance. No recent change in weight. HEMATOLOGICAL: No anemia or easy bruising or bleeding. NEUROLOGIC: No headache, seizures, numbness, tingling or weakness. PSYCHIATRIC: No depression, no loss of interest in normal activity or change in sleep pattern.     Exam: chaperone present  BP 118/76 mmHg  Ht 5\' 1"  (1.549 m)  Wt 128 lb 3.2 oz (58.151 kg)  BMI  24.24 kg/m2  LMP 11/19/2013  Body mass index is 24.24 kg/(m^2).  General appearance : Well developed well nourished female. No acute distress HEENT: Neck supple, trachea midline, no carotid bruits, no thyroidmegaly Lungs: Clear to auscultation, no rhonchi or wheezes, or rib retractions  Heart: Regular rate and rhythm, no murmurs or gallops Breast:Examined in sitting and supine position were symmetrical in appearance, no palpable masses or tenderness,  no skin retraction, no nipple inversion, no nipple discharge,  no skin discoloration, no axillary or supraclavicular lymphadenopathy Abdomen: no palpable masses or tenderness, no rebound or guarding Extremities: no edema or skin discoloration or tenderness  Pelvic:  Bartholin urethra Skene was, within normal limits Vagina: No lesions or discharge Cervix: No lesions or discharge Uterus approximately 24 weeks size (3 finger breast above the umbilicus) Adnexa: No palpable masses or tenderness Rectal exam unremarkable   Assessment/Plan:  53 y.o. female for annual exwith enlarging leiomyomatous uteri. We discussed the concerns about potential of a leiomyosarcoma. Through the year she has been offered her hysterectomy but has been hesitant. She's now seriously looking at some time in within the next year to have her hysterectomy. She will contact the office and we can schedule accordingly and for me to see her 1 week for preop. A fasting blood sugar along with hepatitis C and FSH will be drawn today. Pap smear was done today. Patient will receive her TDAP vaccine today. Next year she will need a baseline bone density study.   Terrance Mass MD, 10:20 AM 01/19/2014

## 2014-01-20 LAB — GLUCOSE, FASTING: Glucose, Fasting: 95 mg/dL (ref 70–99)

## 2014-01-20 LAB — FOLLICLE STIMULATING HORMONE: FSH: 28.2 m[IU]/mL

## 2014-01-20 LAB — HEPATITIS C ANTIBODY: HCV Ab: NEGATIVE

## 2014-01-22 LAB — CYTOLOGY - PAP

## 2014-03-02 ENCOUNTER — Telehealth: Payer: Self-pay

## 2014-03-02 NOTE — Telephone Encounter (Signed)
At Dr. Durenda Guthrie request I called patient to see if she is ready to schedule TAH,BSO.  Her husband answered and patient was at work. I have her permission to speak with him and I told him I was calling just to touch base and see if she is ready to schedule surgery and if not just to call me when she is ready and I will be glad to assist.

## 2014-07-05 ENCOUNTER — Encounter: Payer: Self-pay | Admitting: Gynecology

## 2015-01-18 ENCOUNTER — Other Ambulatory Visit: Payer: Self-pay | Admitting: *Deleted

## 2015-01-18 DIAGNOSIS — D251 Intramural leiomyoma of uterus: Secondary | ICD-10-CM

## 2015-02-27 ENCOUNTER — Other Ambulatory Visit: Payer: Self-pay | Admitting: Gynecology

## 2015-02-27 ENCOUNTER — Ambulatory Visit (INDEPENDENT_AMBULATORY_CARE_PROVIDER_SITE_OTHER): Payer: Commercial Managed Care - PPO | Admitting: Gynecology

## 2015-02-27 ENCOUNTER — Ambulatory Visit (INDEPENDENT_AMBULATORY_CARE_PROVIDER_SITE_OTHER): Payer: Commercial Managed Care - PPO

## 2015-02-27 ENCOUNTER — Encounter: Payer: Self-pay | Admitting: Gynecology

## 2015-02-27 VITALS — BP 138/90 | Ht 60.0 in | Wt 125.0 lb

## 2015-02-27 DIAGNOSIS — R938 Abnormal findings on diagnostic imaging of other specified body structures: Secondary | ICD-10-CM

## 2015-02-27 DIAGNOSIS — Z01419 Encounter for gynecological examination (general) (routine) without abnormal findings: Secondary | ICD-10-CM | POA: Diagnosis not present

## 2015-02-27 DIAGNOSIS — R9389 Abnormal findings on diagnostic imaging of other specified body structures: Secondary | ICD-10-CM

## 2015-02-27 DIAGNOSIS — N852 Hypertrophy of uterus: Secondary | ICD-10-CM | POA: Diagnosis not present

## 2015-02-27 DIAGNOSIS — D251 Intramural leiomyoma of uterus: Secondary | ICD-10-CM

## 2015-02-27 NOTE — Progress Notes (Signed)
Ashlee Harmon 12/17/1960 [id number]   History:    55 y.o.  for annual gyn exam with no complaints today. Patient with known history of large fibroid uterus. Patient had her blood work done at her work on January of this year and brought her lab work with her which she will share with her PCP in the next few weeks. Essentially her comprehensive metabolic panel indicated that her BUN was at 24 upper limits of normal being 23. Her hemoglobin A1c was in the prediabetic range with a value of 6.1. Her TSH was normal as was her CBC and her BUN repeated  was in the normal range. Patient's flu vaccine is up-to-date. She reports normal colonoscopy in 2011. Patient on no hormone replacement therapy reports very minimal vasomotor symptoms. Patient with no past history of any abnormal Pap smears. She had been using withdrawal for contraception.  Her ultrasound the past 2 years of been as follows: 2015: Uterus measured 22.2 x 15.1 x 10.6 cm with endometrial stripe of 4 mm. Several subserosal intramural myomas totaling 6 the largest one measuring 54 x 45 mm. Endometrial cavity with with fluid filled area in the fundus. Noted echogenic mass left uterus suggestive within endometrial cavity measured 70 bites 26 x 25 mm. Right and left ovary were otherwise normal. 2016: Uterus is increased in size now measuring 24 x 16 x 9.3 with endometrial 14.8 multiple fibroids the largest one recorded measuring 10.4 x 7.5 x 5.3 cm and ovaries appeared normal no fluid in the cul-de-sac.  2017: Uterus measuring 19.7 x 12.6 x 9.3 cm with endometrial stripe of 8.3 mm. Several intramural fibroids largest 1 measuring 51 x 44 and 56 x 39 and 41 x 19 mm, and 93 x 74 x 88 mm. Prominent endometrial cavity was reported both ovaries otherwise were normal.  Past medical history,surgical history, family history and social history were all reviewed and documented in the EPIC chart.  Gynecologic History Patient's last menstrual period was  05/04/2014. Contraception: post menopausal status Last Pap: 2015. Results were: normal Last mammogram: 2016. Results were: Normal but dense  Obstetric History OB History  Gravida Para Term Preterm AB SAB TAB Ectopic Multiple Living  3 2   1     2     # Outcome Date GA Lbr Len/2nd Weight Sex Delivery Anes PTL Lv  3 AB           2 Para           1 Para                ROS: A ROS was performed and pertinent positives and negatives are included in the history.  GENERAL: No fevers or chills. HEENT: No change in vision, no earache, sore throat or sinus congestion. NECK: No pain or stiffness. CARDIOVASCULAR: No chest pain or pressure. No palpitations. PULMONARY: No shortness of breath, cough or wheeze. GASTROINTESTINAL: No abdominal pain, nausea, vomiting or diarrhea, melena or bright red blood per rectum. GENITOURINARY: No urinary frequency, urgency, hesitancy or dysuria. MUSCULOSKELETAL: No joint or muscle pain, no back pain, no recent trauma. DERMATOLOGIC: No rash, no itching, no lesions. ENDOCRINE: No polyuria, polydipsia, no heat or cold intolerance. No recent change in weight. HEMATOLOGICAL: No anemia or easy bruising or bleeding. NEUROLOGIC: No headache, seizures, numbness, tingling or weakness. PSYCHIATRIC: No depression, no loss of interest in normal activity or change in sleep pattern.     Exam: chaperone present  BP 138/90 mmHg  Ht  5' (1.524 m)  Wt 125 lb (56.7 kg)  BMI 24.41 kg/m2  LMP 05/04/2014  Body mass index is 24.41 kg/(m^2).  General appearance : Well developed well nourished female. No acute distress HEENT: Eyes: no retinal hemorrhage or exudates,  Neck supple, trachea midline, no carotid bruits, no thyroidmegaly Lungs: Clear to auscultation, no rhonchi or wheezes, or rib retractions  Heart: Regular rate and rhythm, no murmurs or gallops Breast:Examined in sitting and supine position were symmetrical in appearance, no palpable masses or tenderness,  no skin retraction,  no nipple inversion, no nipple discharge, no skin discoloration, no axillary or supraclavicular lymphadenopathy Abdomen: no palpable masses or tenderness, no rebound or guarding Extremities: no edema or skin discoloration or tenderness  Pelvic:  Bartholin, Urethra, Skene Glands: Within normal limits             Vagina: No gross lesions or discharge  Cervix: No gross lesions or discharge  Uterus  measurement up to the level of the umbilicus nontender  Adnexa  difficult to evaluate due to the size of uterus normal on ultrasound today  Anus and perineum  normal   Rectovaginal  normal sphincter tone without palpated masses or tenderness             Hemoccult PCP will provide     Assessment/Plan:  55 y.o. female for annual exam with history of leiomyomatous uteri patient refusing surgical intervention. I raised a concern of potential risk for leiomyosarcoma although her fibroid uterus appears to be stable in size. Because of the endometrial thickness noted on ultrasound in patient not on any hormonal replacement therapy and endometrial biopsy was done today and submitted for pathological evaluation. She will return back to the office next week for sonohysterogram to better assess intrauterine cavity. Her Pap smear was not done today.   Terrance Mass MD, 2:05 PM 02/27/2015

## 2015-02-27 NOTE — Patient Instructions (Signed)

## 2015-02-28 ENCOUNTER — Encounter: Payer: Self-pay | Admitting: Gynecology

## 2015-03-12 ENCOUNTER — Telehealth: Payer: Self-pay | Admitting: Gynecology

## 2015-03-12 NOTE — Telephone Encounter (Signed)
03/12/15-I spoke w/pt husband(ok per DPR) as pt is out of town to let him know that her [place] ins puts the cost of the sonohysterogram towards her $750.00 deductible of which only $31.85 has been met. Allowable for test is $893.27 so pt would owe $718.15 plus 10%coins on remaining  balance of $17.51. Pt total is $735.66 plus bx($275.02 x 10% = $27.50) I asked for half down or for them to let me know what they can do before the test.He will speak to his wife and have her call me back.wl

## 2015-03-18 ENCOUNTER — Ambulatory Visit: Payer: Commercial Managed Care - PPO | Admitting: Gynecology

## 2015-03-18 ENCOUNTER — Other Ambulatory Visit: Payer: Commercial Managed Care - PPO

## 2016-06-17 ENCOUNTER — Encounter: Payer: Self-pay | Admitting: Gynecology

## 2017-01-04 NOTE — Progress Notes (Signed)
Ms. Mayville received her flu shot on 01/01/17 at the [facility] to LT deltoid. Lot # [id number] NDC: [phone] Mfg: [employer] Expires: 08/01/17
